# Patient Record
Sex: Female | Born: 1995 | Race: Black or African American | Hispanic: No | Marital: Single | Smoking: Never smoker
Health system: Southern US, Community
[De-identification: ages and names within clinical notes are randomized; demographics above are authoritative.]

## PROBLEM LIST (undated history)

## (undated) DIAGNOSIS — G808 Other cerebral palsy: Secondary | ICD-10-CM

## (undated) DIAGNOSIS — R569 Unspecified convulsions: Secondary | ICD-10-CM

## (undated) HISTORY — PX: REFRACTIVE SURGERY: SHX103

## (undated) HISTORY — PX: FLEXOR TENDON REPAIR: SHX1651

---

## 2016-02-11 ENCOUNTER — Encounter (HOSPITAL_COMMUNITY): Payer: Self-pay | Admitting: Emergency Medicine

## 2016-02-11 ENCOUNTER — Emergency Department (HOSPITAL_COMMUNITY)
Admission: EM | Admit: 2016-02-11 | Discharge: 2016-02-11 | Disposition: A | Discharge status: Home / Self Care | Payer: Medicaid Other | Attending: Emergency Medicine | Admitting: Emergency Medicine

## 2016-02-11 ENCOUNTER — Emergency Department (HOSPITAL_COMMUNITY): Payer: Medicaid Other

## 2016-02-11 DIAGNOSIS — Z8669 Personal history of other diseases of the nervous system and sense organs: Secondary | ICD-10-CM | POA: Insufficient documentation

## 2016-02-11 DIAGNOSIS — Z79899 Other long term (current) drug therapy: Secondary | ICD-10-CM | POA: Diagnosis not present

## 2016-02-11 DIAGNOSIS — R040 Epistaxis: Secondary | ICD-10-CM | POA: Diagnosis not present

## 2016-02-11 DIAGNOSIS — R51 Headache: Secondary | ICD-10-CM | POA: Diagnosis not present

## 2016-02-11 DIAGNOSIS — R519 Headache, unspecified: Secondary | ICD-10-CM

## 2016-02-11 DIAGNOSIS — Z793 Long term (current) use of hormonal contraceptives: Secondary | ICD-10-CM | POA: Diagnosis not present

## 2016-02-11 DIAGNOSIS — Z3202 Encounter for pregnancy test, result negative: Secondary | ICD-10-CM | POA: Diagnosis not present

## 2016-02-11 HISTORY — DX: Unspecified convulsions: R56.9

## 2016-02-11 HISTORY — DX: Other cerebral palsy: G80.8

## 2016-02-11 LAB — BASIC METABOLIC PANEL
Anion gap: 12 (ref 5–15)
BUN: 8 mg/dL (ref 6–20)
CALCIUM: 9.9 mg/dL (ref 8.9–10.3)
CO2: 24 mmol/L (ref 22–32)
CREATININE: 0.59 mg/dL (ref 0.44–1.00)
Chloride: 105 mmol/L (ref 101–111)
GFR calc non Af Amer: >60 mL/min (ref >60)
Glucose, Bld: 92 mg/dL (ref 65–99)
Potassium: 3.7 mmol/L (ref 3.5–5.1)
SODIUM: 141 mmol/L (ref 135–145)

## 2016-02-11 LAB — CBC
HCT: 40.8 % (ref 36.0–46.0)
Hemoglobin: 12.9 g/dL (ref 12.0–15.0)
MCH: 22.6 pg — ABNORMAL LOW (ref 26.0–34.0)
MCHC: 31.6 g/dL (ref 30.0–36.0)
MCV: 71.6 fL — ABNORMAL LOW (ref 78.0–100.0)
PLATELETS: 655 10*3/uL — AB (ref 150–400)
RBC: 5.7 MIL/uL — AB (ref 3.87–5.11)
RDW: 14.9 % (ref 11.5–15.5)
WBC: 10.8 10*3/uL — AB (ref 4.0–10.5)

## 2016-02-11 LAB — I-STAT BETA HCG BLOOD, ED (MC, WL, AP ONLY)

## 2016-02-11 LAB — CBG MONITORING, ED: Glucose-Capillary: 80 mg/dL (ref 65–99)

## 2016-02-11 MED ORDER — SALINE SPRAY 0.65 % NA SOLN: 1.0000 | NASAL | Status: DC | PRN

## 2016-02-11 NOTE — Discharge Instructions (Signed)
General Headache Without Cause A headache is pain or discomfort felt around the head or neck area. There are many causes and types of headaches. In some cases, the cause may not be found.  HOME CARE  Managing Pain  Take over-the-counter and prescription medicines only as told by your doctor.  Lie down in a dark, quiet room when you have a headache.  If directed, apply ice to the head and neck area:  Put ice in a plastic bag.  Place a towel between your skin and the bag.  Leave the ice on for 20 minutes, 2-3 times per day.  Use a heating pad or hot shower to apply heat to the head and neck area as told by your doctor.  Keep lights dim if Revolorio lights bother you or make your headaches worse. Eating and Drinking  Eat meals on a regular schedule.  Lessen how much alcohol you drink.  Lessen how much caffeine you drink, or stop drinking caffeine. General Instructions  Keep all follow-up visits as told by your doctor. This is important.  Keep a journal to find out if certain things bring on headaches. For example, write down:  What you eat and drink.  How much sleep you get.  Any change to your diet or medicines.  Relax by getting a massage or doing other relaxing activities.  Lessen stress.  Sit up straight. Do not tighten (tense) your muscles.  Do not use tobacco products. This includes cigarettes, chewing tobacco, or e-cigarettes. If you need help quitting, ask your doctor.  Exercise regularly as told by your doctor.  Get enough sleep. This often means 7-9 hours of sleep. GET HELP IF:  Your symptoms are not helped by medicine.  You have a headache that feels different than the other headaches.  You feel sick to your stomach (nauseous) or you throw up (vomit).  You have a fever. GET HELP RIGHT AWAY IF:   Your headache becomes really bad.  You keep throwing up.  You have a stiff neck.  You have trouble seeing.  You have trouble speaking.  You have  pain in the eye or ear.  Your muscles are weak or you lose muscle control.  You lose your balance or have trouble walking.  You feel like you will pass out (faint) or you pass out.  You have confusion.   This information is not intended to replace advice given to you by your health care provider. Make sure you discuss any questions you have with your health care provider.   Document Released: 09/17/2008 Document Revised: 08/30/2015 Document Reviewed: 04/03/2015 Elsevier Interactive Patient Education 2016 [employer]. Nosebleed Nosebleeds are common. They are due to a crack in the inside lining of your nose (mucous membrane) or from a small blood vessel that starts to bleed. Nosebleeds can be caused by many conditions, such as injury, infections, dry mucous membranes or dry climate, medicines, nose picking, and home heating and cooling systems. Most nosebleeds come from blood vessels in the front of your nose. HOME CARE INSTRUCTIONS   Try controlling your nosebleed by pinching your nostrils gently and continuously for at least 10 minutes.  Avoid blowing or sniffing your nose for a number of hours after having a nosebleed.  Do not put gauze inside your nose yourself. If your nose was packed by your health care provider, try to maintain the pack inside of your nose until your health care provider removes it.  If a gauze pack was used  and it starts to fall out, gently replace it or cut off the end of it.  If a balloon catheter was used to pack your nose, do not cut or remove it unless your health care provider has instructed you to do that.  Avoid lying down while you are having a nosebleed. Sit up and lean forward.  Use a nasal spray decongestant to help with a nosebleed as directed by your health care provider.  Do not use petroleum jelly or mineral oil in your nose. These can drip into your lungs.  Maintain humidity in your home by using less air conditioning or by using a  humidifier.  Aspirinand blood thinners make bleeding more likely. If you are prescribed these medicines and you suffer from nosebleeds, ask your health care provider if you should stop taking the medicines or adjust the dose. Do not stop medicines unless directed by your health care provider  Resume your normal activities as you are able, but avoid straining, lifting, or bending at the waist for several days.  If your nosebleed was caused by dry mucous membranes, use over-the-counter saline nasal spray or gel. This will keep the mucous membranes moist and allow them to heal. If you must use a lubricant, choose the water-soluble variety. Use it only sparingly, and do not use it within several hours of lying down.  Keep all follow-up visits as directed by your health care provider. This is important. SEEK MEDICAL CARE IF:  You have a fever.  You get frequent nosebleeds.  You are getting nosebleeds more often. SEEK IMMEDIATE MEDICAL CARE IF:  Your nosebleed lasts longer than 20 minutes.  Your nosebleed occurs after an injury to your face, and your nose looks crooked or broken.  You have unusual bleeding from other parts of your body.  You have unusual bruising on other parts of your body.  You feel light-headed or you faint.  You become sweaty.  You vomit blood.  Your nosebleed occurs after a head injury.   This information is not intended to replace advice given to you by your health care provider. Make sure you discuss any questions you have with your health care provider.   Document Released: 09/18/2005 Document Revised: 12/30/2014 Document Reviewed: 07/25/2014 Elsevier Interactive Patient Education [employer]2016 Elsevier Inc.

## 2016-02-11 NOTE — ED Provider Notes (Signed)
CSN: [id number]     Arrival date & time 02/11/16  1316 History   First MD Initiated Contact with Patient 02/11/16 1739     Chief Complaint  Patient presents with  . Headache  . 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  Patient states that over the past several weeks she has started getting severe headaches.  She states that when she gets the headaches, she will also get nosebleeds.  She states that then when the nosebleed stops, her headache resolves.  She is no longer followed by neurology.  She also states that occasionally she has staring spells, and is unaware of her surroundings during these spells.  She has not had any today. She is working to follow-up with neurology.  The history is provided by the patient. No language interpreter was used.    Past Medical History  Diagnosis Date  . Cerebral palsy, hemiplegic (HCC)     Left sided hemiplegia  . Seizures [facility])    Past Surgical History  Procedure Laterality Date  . Flexor tendon repair Left    History reviewed. No pertinent family history. Social History  Substance Use Topics  . Smoking status: None  . Smokeless tobacco: None  . Alcohol Use: None   OB History    No data available     Review of Systems  HENT: Positive for nosebleeds.   Neurological: Positive for headaches.  All other systems reviewed and are negative.     Allergies  Review of patient's allergies indicates no known allergies.  Home Medications   Prior to Admission medications   Medication Sig Start Date End Date Taking? Authorizing Provider  loratadine (CLARITIN) 10 MG tablet Take 1 tablet by mouth at bedtime. 01/31/16  Yes Historical Provider, MD  MINASTRIN 24 FE 1-20 MG-MCG(24) CHEW Chew 1 tablet by mouth at bedtime. 02/09/16  Yes Historical Provider, MD  montelukast (SINGULAIR)  10 MG tablet Take 1 tablet by mouth at bedtime. 01/31/16  Yes Historical Provider, MD   BP 154/96 mmHg  Pulse 97  Temp(Src) 98.5 F (36.9 C) (Oral)  Resp 18  SpO2 99% Physical Exam  Constitutional: She is oriented to person, place, and time. She appears well-developed and well-nourished.  HENT:  Head: Normocephalic and atraumatic.  Eyes: Conjunctivae and EOM are normal. Pupils are equal, round, and reactive to light.  Neck: Normal range of motion. Neck supple.  Cardiovascular: Normal rate and regular rhythm.  Exam reveals no gallop and no friction rub.   No murmur heard. Pulmonary/Chest: Effort normal and breath sounds normal. No respiratory distress. She has no wheezes. She has no rales. She exhibits no tenderness.  Abdominal: Soft. Bowel sounds are normal. She exhibits no distension and no mass. There is no tenderness. There is no rebound and no guarding.  Musculoskeletal: Normal range of motion. She exhibits no edema or tenderness.  Neurological: She is alert and oriented to person, place, and time.  Skin: Skin is warm and dry.  Psychiatric: She has a normal mood and affect. Her behavior is normal. Judgment and thought content normal.  Nursing note and vitals reviewed.   ED Course  Procedures (including critical care time) Results for orders placed or performed during the hospital encounter of 02/11/16  Basic metabolic panel - if new onset seizures  Result Value Ref Range   Sodium 141 135 - 145 mmol/L   Potassium 3.7 3.5 - 5.1  mmol/L   Chloride 105 101 - 111 mmol/L   CO2 24 22 - 32 mmol/L   Glucose, Bld 92 65 - 99 mg/dL   BUN 8 6 - 20 mg/dL   Creatinine, Ser 3.24 0.44 - 1.00 mg/dL   Calcium 9.9 8.9 - 40.1 mg/dL   GFR calc non Af Amer >60 >60 mL/min   GFR calc Af Amer >60 >60 mL/min   Anion gap 12 5 - 15  CBC - if new onset seizures  Result Value Ref Range   WBC 10.8 (H) 4.0 - 10.5 K/uL   RBC 5.70 (H) 3.87 - 5.11 MIL/uL   Hemoglobin 12.9 12.0 - 15.0 g/dL   HCT 02.7 25.3  - 66.4 %   MCV 71.6 (L) 78.0 - 100.0 fL   MCH 22.6 (L) 26.0 - 34.0 pg   MCHC 31.6 30.0 - 36.0 g/dL   RDW 40.3 47.4 - 25.9 %   Platelets 655 (H) 150 - 400 K/uL  CBG monitoring, ED  Result Value Ref Range   Glucose-Capillary 80 65 - 99 mg/dL  I-Stat beta hCG blood, ED ([facility], [facility], [facility] only)  Result Value Ref Range   I-stat hCG, quantitative <5.0 <5 mIU/mL   Comment 3           Ct Head Wo Contrast  02/11/2016  CLINICAL DATA:  Recurrent headaches and occasional nosebleeds. Altered mental status during these episodes. History of seizures. Initial encounter. EXAM: CT HEAD WITHOUT CONTRAST CT MAXILLOFACIAL WITHOUT CONTRAST TECHNIQUE: Multidetector CT imaging of the head and maxillofacial structures were performed using the standard protocol without intravenous contrast. Multiplanar CT image reconstructions of the maxillofacial structures were also generated. COMPARISON:  None. FINDINGS: CT HEAD FINDINGS There is volume loss in the right cerebral hemisphere with associated ex vacuo dilatation of the right lateral ventricle. No evidence of acute intracranial abnormality including hemorrhage, infarct, midline shift or abnormal extra-axial fluid collection is identified. No hydrocephalus or pneumocephalus. The calvarium is intact. Mild mucosal thickening is seen in the sphenoid sinus. CT MAXILLOFACIAL FINDINGS Mucous retention cyst or polyp is seen in the floor of the left maxillary sinus and there is also mild mucosal thickening in the left maxillary. Minimal mucosal thickening in the inferior aspect of the right maxillary sinus is identified. A very small mucous retention cyst or polyp is seen along the medial wall of the right maxillary sinus. Imaged paranasal sinuses are otherwise clear. The ostiomeatal complexes are patent. Concha bullosa are seen bilaterally. Imaged cervical spine appears normal. The orbits are unremarkable. IMPRESSION: No acute abnormality head or face. Volume loss in the right cerebral  hemisphere with associated ex vacuo dilatation of the right lateral ventricle. Mild sinus disease most notable in the left maxillary where there is a mucous retention cyst or polyp. Electronically Signed   By: Drusilla Kanner M.D.   On: 02/11/2016 18:33   Ct Maxillofacial Wo Cm  02/11/2016  CLINICAL DATA:  Recurrent headaches and occasional nosebleeds. Altered mental status during these episodes. History of seizures. Initial encounter. EXAM: CT HEAD WITHOUT CONTRAST CT MAXILLOFACIAL WITHOUT CONTRAST TECHNIQUE: Multidetector CT imaging of the head and maxillofacial structures were performed using the standard protocol without intravenous contrast. Multiplanar CT image reconstructions of the maxillofacial structures were also generated. COMPARISON:  None. FINDINGS: CT HEAD FINDINGS There is volume loss in the right cerebral hemisphere with associated ex vacuo dilatation of the right lateral ventricle. No evidence of acute intracranial abnormality including hemorrhage, infarct, midline shift or abnormal extra-axial fluid  collection is identified. No hydrocephalus or pneumocephalus. The calvarium is intact. Mild mucosal thickening is seen in the sphenoid sinus. CT MAXILLOFACIAL FINDINGS Mucous retention cyst or polyp is seen in the floor of the left maxillary sinus and there is also mild mucosal thickening in the left maxillary. Minimal mucosal thickening in the inferior aspect of the right maxillary sinus is identified. A very small mucous retention cyst or polyp is seen along the medial wall of the right maxillary sinus. Imaged paranasal sinuses are otherwise clear. The ostiomeatal complexes are patent. Concha bullosa are seen bilaterally. Imaged cervical spine appears normal. The orbits are unremarkable. IMPRESSION: No acute abnormality head or face. Volume loss in the right cerebral hemisphere with associated ex vacuo dilatation of the right lateral ventricle. Mild sinus disease most notable in the left  maxillary where there is a mucous retention cyst or polyp. Electronically Signed   By: Drusilla Kanner M.D.   On: 02/11/2016 18:33    I have personally reviewed and evaluated these images and lab results as part of my medical decision-making.    MDM   Final diagnoses:  Nonintractable headache, unspecified chronicity pattern, unspecified headache type  Epistaxis    Patient with headache and nosebleeds.  Possible absence seizures.  No symptoms currently, but concerned about lack of follow-up.  Will check head CT.  Patient discussed with Dr. Preston Fleeting, who recommends adding maxillofacial CT to evaluate sinuses.  Recommends ENT follow-up.  Could be sinus headache.  CT head as above.  Will recommend ENT follow-up.  Patient has an appointment on 3/2. Recommend follow-up with neurology as well for staring spells.     Roxy Horseman, PA-C 02/11/16 1850  Dione Booze, MD 02/11/16 [phone]

## 2016-02-11 NOTE — ED Notes (Signed)
C/o recurrent headaches with occasional nose bleeds, and pt starts staring off, unaware of her surroundings when this happens. Was seen at urgent care and the doctor believes she may be having seizures again, mother states she had seizures when she was younger but had stopped. Has been discharged from [facility] neurology a few years ago. Hx mild cerebral palsy with left hemiplegia, mother states the seizures she had when she was younger she presented like this.

## 2016-02-16 ENCOUNTER — Encounter: Payer: Self-pay | Admitting: Neurology

## 2016-02-16 ENCOUNTER — Ambulatory Visit (INDEPENDENT_AMBULATORY_CARE_PROVIDER_SITE_OTHER): Payer: Medicaid Other | Admitting: Neurology

## 2016-02-16 VITALS — BP 126/70 | HR 99 | Ht 62.5 in | Wt 194.0 lb

## 2016-02-16 DIAGNOSIS — R404 Transient alteration of awareness: Secondary | ICD-10-CM | POA: Diagnosis not present

## 2016-02-16 DIAGNOSIS — G802 Spastic hemiplegic cerebral palsy: Secondary | ICD-10-CM | POA: Insufficient documentation

## 2016-02-16 NOTE — Patient Instructions (Addendum)
1. Schedule MRI brain with and without contrast. We have scheduled you at [facility] for your MRI on 02/26/16 at 6:30 pm. Please arrive 30 minutes prior and go to [address]. If you need to change this appt please call [phone]. 2. Schedule 1-hour sleep-deprived EEG 3. Minimize intake of Tylenol/Ibuprofen/Aleve to 2-3 a times a week to avoid rebound headaches 4. As per [place] driving laws, after an episode of loss of awareness, one should not drive until 6 months event-free 5. Follow-up in 1 month, call for any changes

## 2016-02-16 NOTE — Progress Notes (Signed)
Note routed to Dr Wilford Grist and Filomena Jungling, NP.

## 2016-02-16 NOTE — Progress Notes (Signed)
NEUROLOGY CONSULTATION NOTE  Angela Lutz MRN: [id number] DOB: 12-13-1996  Referring provider: Dr. Filomena Jungling, FNP Primary care provider: Dr. Nile Riggs  Reason for consult:  History of seizures, possible recent seizure. Daily headaches  Thank you for your kind referral of Angela Lutz for consultation of the above symptoms. Although her history is well known to you, please allow me to reiterate it for the purpose of our medical record. I spoke to her mother on the phone to obtain further details on her childhood. Records and images were personally reviewed where available.  HISTORY OF PRESENT ILLNESS: This is a pleasant 20 year old right-handed woman with a history of congenital left hemiparesis, cerebral palsy, seizures, and headaches, previously followed at [facility], presenting due to concern of seizure recurrence and increasing headaches. Her mother reports that seizures started at age 89 or 3, she would have a blank stare and become unresponsive for a few seconds, no convulsive activity or myoclonic jerks. She was diagnosed with seizures and started on Tegretol. Per [facility] records, last seizure was in 2003. She was tapered off Tegretol in her early teens and her mother has not noticed any further staring spells, however over the past 2 years, Angela Lutz has noticed that she would stare off but just chalked it up to "staring off," until she went to an Urgent Care last week for epistaxis and was told she may be having seizures. Her roommate then told her that she sometimes has to snap her fingers in front of Angela Lutz and not realize that someone was trying to get her attention. She has an occasional taste of blood in her mouth, but feels this is due to the epistaxis that has been going on every other day. She sees ENT next week. She has a chronic history of headaches noted since at least 2012, diagnosed as tension-type headaches. She and her mother report that headaches have been  increasing in the past 2 years. Headaches are over the frontal region radiating to the back, with pressure, sharp/stabbing, or throbbing pain. Today she is having head pressure on the left parietal region. Headache intensity is from a 6 or 7 to 10 over 10, her vision gets blurry and she sees haloes around objects. She went to Urgent Care because she was having nausea with the headaches. Headaches last all day, she had been taking Ibuprofen every 6-8 hours, then stopped this briefly for a month, but since Thursday has been taking it multiple times a day. She denies any dizziness, tinnitus, focal numbness/tingling, dysarthria/dysphagia, bowel/bladder dysfunction. She has been having occasional neck pain with the headaches, and back pains for the past year. She is a Nutrition major graduating in 2019 or 2020, reports this has been a stressful semester but denies missing any parts of the lesson while sitting in class.  There is no family history of migraines or seizures. She was born full-term, her mother reports having surgery at 4 months of pregnancy, but no problems during delivery. They started to notice she was not using her left hand at 28 to 61 months of age, and was diagnosed with cerebral palsy at 6 or 7 months. No history of febrile convulsions, CNS infections such as meningitis/encephalitis, significant traumatic brain injury, neurosurgical procedures  I personally reviewed CT head without contrast done 02/11/16 which did not show any acute changes. There is volume loss in the right cerebral hemisphere with associated ex vacuo dilatation of the right lateral ventricle.   Prior AEDs: Tegretol  PAST MEDICAL HISTORY: Past Medical History  Diagnosis Date  . Cerebral palsy, hemiplegic (HCC)     Left sided hemiplegia  . Seizures (HCC)     PAST SURGICAL HISTORY: Past Surgical History  Procedure Laterality Date  . Flexor tendon repair Left   . Refractive surgery Left     MEDICATIONS: Current  Outpatient Prescriptions on File Prior to Visit  Medication Sig Dispense Refill  . loratadine (CLARITIN) 10 MG tablet Take 1 tablet by mouth at bedtime.  0  . MINASTRIN 24 FE 1-20 MG-MCG(24) CHEW Chew 1 tablet by mouth at bedtime.  0  . montelukast (SINGULAIR) 10 MG tablet Take 1 tablet by mouth at bedtime.  0  . sodium chloride (OCEAN) 0.65 % SOLN nasal spray Place 1 spray into both nostrils as needed for congestion. 1 Bottle 0   No current facility-administered medications on file prior to visit.    ALLERGIES: No Known Allergies  FAMILY HISTORY: Family History  Problem Relation Age of Onset  . Hypertension Mother     SOCIAL HISTORY: Social History   Social History  . Marital Status: Single    Spouse Name: N/A  . Number of Children: N/A  . Years of Education: N/A   Occupational History  . Not on file.   Social History Main Topics  . Smoking status: Never Smoker   . Smokeless tobacco: Not on file  . Alcohol Use: No  . Drug Use: No  . Sexual Activity: Not on file   Other Topics Concern  . Not on file   Social History Narrative    REVIEW OF SYSTEMS: Constitutional: No fevers, chills, or sweats, no generalized fatigue, change in appetite Eyes: No visual changes, double vision, eye pain Ear, nose and throat: No hearing loss, ear pain, nasal congestion, sore throat Cardiovascular: No chest pain, palpitations Respiratory:  No shortness of breath at rest or with exertion, wheezes GastrointestinaI: No nausea, vomiting, diarrhea, abdominal pain, fecal incontinence Genitourinary:  No dysuria, urinary retention or frequency Musculoskeletal:  + neck pain, back pain Integumentary: No rash, pruritus, skin lesions Neurological: as above Psychiatric: No depression, insomnia, anxiety Endocrine: No palpitations, fatigue, diaphoresis, mood swings, change in appetite, change in weight, increased thirst Hematologic/Lymphatic:  No anemia, purpura, petechiae. Allergic/Immunologic:  no itchy/runny eyes, nasal congestion, recent allergic reactions, rashes  PHYSICAL EXAM: Filed Vitals:   02/16/16 1346  BP: 126/70  Pulse: 99   General: No acute distress Head:  Normocephalic/atraumatic Eyes: Fundoscopic exam shows bilateral sharp discs, no vessel changes, exudates, or hemorrhages Neck: supple, no paraspinal tenderness, full range of motion Back: No paraspinal tenderness Heart: regular rate and rhythm Lungs: Clear to auscultation bilaterally. Vascular: No carotid bruits. Skin/Extremities: No rash, no edema Neurological Exam: Mental status: alert and oriented to person, place, and time, no dysarthria or aphasia, Fund of knowledge is appropriate.  Recent and remote memory are intact.  Attention and concentration are normal.    Able to name objects and repeat phrases. Cranial nerves: CN I: not tested CN II: pupils equal, round and reactive to light, visual fields intact, fundi unremarkable. CN III, IV, VI:  full range of motion, no nystagmus, no ptosis CN V: reports hyperesthesia to pin on left V1-2 CN VII: upper and lower face symmetric CN VIII: hearing intact to finger rub CN IX, X: gag intact, uvula midline CN XI: sternocleidomastoid and trapezius muscles intact CN XII: tongue midline Bulk & Tone: increased tone on left UE and LE with elbow held in flexion,  wrist flexion, foot pronated, no fasciculations. Motor: 5/5 on right UE and LE, 5/5 proximal left UE and LE, with 3/5 left finger flexion/extension, 2/5 left foot plantar/dorsiflexion. Sensation: reports decreased pin on left UE, decreased cold on left LE, hyperesthesia to pin on left LE, intact vibration sense.  Romberg test negative Deep Tendon Reflexes: +2 throughout, no ankle clonus but left ankle has increased tone Plantar responses: downgoing bilaterally Cerebellar: no incoordination on finger to nose on right, difficulty with left due to hemiparesis Gait: spastic hemiparetic gait  Tremor:  none  IMPRESSION: This is a pleasant 20 year old right-handed woman with a history of left hemiparetic cerebral palsy, seizures in childhood, tension-type headaches, presenting for recent episodes of staring concerning for seizure recurrence. MRI brain with and without contrast and 1-hour sleep-deprived EEG will be ordered as part of seizure workup. She is having chronic daily headaches, this appears to be a chronic condition since age 57, diagnosed as tension-type headaches, however there are some migrainous features to her headaches. We will discuss headache preventative medication on her follow-up, pending EEG results, we discussed that if EEG abnormal, would start a medication that helps with both seizure and headache prophylaxis. There may be a component of medication overuse headaches as well, she was advised to minimize rescue medication to 2-3 a week to avoid rebound headaches. [place] driving laws were discussed with the patient, and she knows to stop driving after a seizure, until 6 months seizure-free. She had been referred to OT in the past due to concern about left hand spasticity affecting driving. She will follow-up in 1 month.   Thank you for allowing me to participate in the care of this patient. Please do not hesitate to call for any questions or concerns.   Patrcia Dolly, M.D.  CC: Dr. Wilford Grist, Filomena Jungling, FNP

## 2016-02-26 ENCOUNTER — Other Ambulatory Visit: Payer: Self-pay | Admitting: *Deleted

## 2016-02-26 ENCOUNTER — Ambulatory Visit (INDEPENDENT_AMBULATORY_CARE_PROVIDER_SITE_OTHER): Payer: Medicaid Other | Admitting: Neurology

## 2016-02-26 ENCOUNTER — Ambulatory Visit
Admission: RE | Admit: 2016-02-26 | Discharge: 2016-02-26 | Disposition: A | Discharge status: Home / Self Care | Payer: Medicaid Other | Source: Ambulatory Visit | Attending: Neurology | Admitting: Neurology

## 2016-02-26 DIAGNOSIS — G802 Spastic hemiplegic cerebral palsy: Secondary | ICD-10-CM

## 2016-02-26 DIAGNOSIS — R404 Transient alteration of awareness: Secondary | ICD-10-CM

## 2016-02-26 MED ORDER — GADOBENATE DIMEGLUMINE 529 MG/ML IV SOLN
19.0000 mL | Freq: Once | INTRAVENOUS | Status: AC | PRN
  Administered 2016-02-26: 19 mL via INTRAVENOUS

## 2016-02-26 NOTE — Procedures (Signed)
ELECTROENCEPHALOGRAM REPORT  Date of Study: 02/26/2016  Patient's Name: Angela Lutz MRN: [id number] Date of Birth: 05-15-1996  Referring Provider: Dr. Patrcia DollyKaren Tab Rylee  Clinical History: This is a 20 year old woman with a history of left hemiparetic cerebral palsy, seizures in childhood, with recent episodes of staring concerning for seizure recurrence.   Medications: Claritin, Minastrin 24 Fe, Singulair  Technical Summary: A multichannel digital 1-hour sleep-deprived EEG recording measured by the international 10-20 system with electrodes applied with paste and impedances below 5000 ohms performed in our laboratory with EKG monitoring in an awake and asleep patient.  Hyperventilation and photic stimulation were performed.  The digital EEG was referentially recorded, reformatted, and digitally filtered in a variety of bipolar and referential montages for optimal display.    Description: The patient is awake and asleep during the recording.  During maximal wakefulness, there is a symmetric, medium voltage 10 Hz posterior dominant rhythm that attenuates with eye opening.  The record is symmetric.  During drowsiness and sleep, there is an increase in theta slowing of the background.  Vertex waves and symmetric sleep spindles were seen, with some asymmetry noted with higher amplitude seen over the left parasagittal derivations.  Hyperventilation and photic stimulation did not elicit any abnormalities.  There were no epileptiform discharges or electrographic seizures seen.    EKG lead was unremarkable.  Impression: This awake and asleep EEG is minimally abnormal due to asymmetric vertex waves with higher amplitude seen over the left parasagittal regions.  Clinical Correlation of the above findings indicates focal cerebral dysfunction over the right hemisphere with lower amplitude vertex waves in this region, suggestive of underlying structural abnormality. The absence of epileptiform discharges  does not exclude a clinical diagnosis of epilepsy.  If further clinical questions remain, prolonged EEG may be helpful.  Clinical correlation is advised.   Patrcia DollyKaren Inza Mikrut, M.D.

## 2016-03-05 ENCOUNTER — Telehealth: Payer: Self-pay | Admitting: Neurology

## 2016-03-05 NOTE — Telephone Encounter (Signed)
Have you reviewed her results? 

## 2016-03-05 NOTE — Telephone Encounter (Signed)
Discussed EEG and MRI results with patient. EEG was overall unremarkable, her MRI brain showed periventricular encephalomalacia on the right, as well as asymmetric T2 changes on the left hippocampus which can be sequelae from recent seizure. She reports 2 more episodes of staring off noticed by family. Would proceed with 24-hour EEG to further classify her symptoms. She expressed understanding.  Darl PikesSusan, can you pls schedule the 24-hour EEG, she is looking at the week of the 27th right now. Thanks

## 2016-03-05 NOTE — Telephone Encounter (Signed)
Pt would like results of MRI. Please call

## 2016-03-06 ENCOUNTER — Other Ambulatory Visit: Payer: Medicaid Other

## 2016-03-06 NOTE — Telephone Encounter (Signed)
Patients f/u appt has been moved to 04/04/16 @ 9am. Will write letter for her for school to be excused on 4/5 & 4/6, will mail letter along with eeg appt letter to her home address. Also made he aware of the change with her f/u appt.

## 2016-03-06 NOTE — Telephone Encounter (Signed)
Called to schedule 24 hour EEG on Wednesday April 5th. There were no available appointments during the week of March 27th. She requested a "doctor's note" which states she will be under doctor's care for those days April 5th and 6th. This is needed for school so she can be absent for that time. I told her I will send her an appointment letter. She stated that is not sufficient so I told her I will pass this on to Dr. Karel JarvisAquino and Elmarie Shileyiffany. She asked that it be faxed to her mother [phone] attention Maia PettiesVonda Moseley, her mother.

## 2016-03-15 ENCOUNTER — Ambulatory Visit: Payer: Medicaid Other | Admitting: Neurology

## 2016-03-22 ENCOUNTER — Ambulatory Visit: Payer: Medicaid Other | Admitting: Neurology

## 2016-03-27 ENCOUNTER — Ambulatory Visit (INDEPENDENT_AMBULATORY_CARE_PROVIDER_SITE_OTHER): Payer: Medicaid Other | Admitting: Neurology

## 2016-03-27 DIAGNOSIS — R404 Transient alteration of awareness: Secondary | ICD-10-CM

## 2016-03-27 DIAGNOSIS — G802 Spastic hemiplegic cerebral palsy: Secondary | ICD-10-CM

## 2016-03-28 ENCOUNTER — Ambulatory Visit: Payer: Medicaid Other | Admitting: Neurology

## 2016-04-04 ENCOUNTER — Ambulatory Visit: Payer: Medicaid Other | Admitting: Neurology

## 2016-04-08 ENCOUNTER — Encounter: Payer: Self-pay | Admitting: Neurology

## 2016-04-08 ENCOUNTER — Ambulatory Visit (INDEPENDENT_AMBULATORY_CARE_PROVIDER_SITE_OTHER): Payer: Medicaid Other | Admitting: Neurology

## 2016-04-08 VITALS — BP 120/92 | HR 89 | Ht 62.5 in | Wt 197.0 lb

## 2016-04-08 DIAGNOSIS — R404 Transient alteration of awareness: Secondary | ICD-10-CM | POA: Diagnosis not present

## 2016-04-08 DIAGNOSIS — G802 Spastic hemiplegic cerebral palsy: Secondary | ICD-10-CM

## 2016-04-08 DIAGNOSIS — R51 Headache: Secondary | ICD-10-CM

## 2016-04-08 DIAGNOSIS — R519 Headache, unspecified: Secondary | ICD-10-CM | POA: Insufficient documentation

## 2016-04-08 MED ORDER — TOPIRAMATE 25 MG PO TABS: ORAL_TABLET | ORAL | Status: DC

## 2016-04-08 NOTE — Patient Instructions (Signed)
1. Start Topamax 25mg : Take 1 tablet at night for 1 week, then increase to 2 tablets at night for 1 week, then increase to 3 tablets at night 2. Keep a calendar of your headaches and episodes of staring off 3. Follow-up in 5 months, call for any changes

## 2016-04-08 NOTE — Progress Notes (Signed)
NEUROLOGY FOLLOW UP OFFICE NOTE  Angela Lutz [id number]  HISTORY OF PRESENT ILLNESS: I had the pleasure of seeing Angela Lutz in follow-up in the neurology clinic on 04/08/2016.  The patient was last seen 2 months ago for concern of seizure recurrence with episodes of staring. She is accompanied by her sister who helps supplement the history today. Angela Lutz denies any episodes of staring off since her last visit. Her sister has not seen any of these staring episodes since Angela Lutz was a child. She continues to have headaches but reports these occur every other day, no associated nausea/vomiting. She occasional sees haloes around objects with the headaches. She denies any dizziness, diplopia, olfactory/gustatory hallucinations. She has chronic left hemiparesis that has been unchanged. She is on birth control.   Records and images were reviewed. I personally reviewed MRI brain with and without contrast which showed right hemisphere white matter volume loss in the frontal and parietal lobes with ex vacuo right lateral ventricle involvement (sparing the temporal and occipital horns). There was mild associated gliosis tracking into the deep posterior white matter capsules, associated mild volume loss at the right midbrain and cerebral peduncle. There was also note of asymmetric T2 hyperintensity in the left hippocampal formation, most apparent in the tail of the hippocampus, however the right hippocampus is smaller as well due to chronic injury. Her wake and sleep EEG was minimally abnormal due to asymmetric vertex waves with higher amplitude over the left parasagittal regions. Her 24-hour EEG was unremarkable, no epileptiform discharges seen. She reported a headaches, chest pain, shaky hand, and waking up at 1am feeling disoriented with a headache, no associated EEG changes seen.  HPI: This is a pleasant 20 yo RH woman with a history of congenital left hemiparesis, cerebral palsy, seizures, and headaches,  previously followed at [facility], who presented for worsening headaches and episodes of "staring off." Her mother reports that seizures started at age 38 or 3, she would have a blank stare and become unresponsive for a few seconds, no convulsive activity or myoclonic jerks. She was diagnosed with seizures and started on Tegretol. Per [facility] records, last seizure was in 2003. She was tapered off Tegretol in her early teens and her mother has not noticed any further staring spells, however over the past 2 years, Angela Lutz has noticed that she would stare off but just chalked it up to "staring off," until she went to an Urgent Care last week for epistaxis and was told she may be having seizures. Her roommate then told her that she sometimes has to snap her fingers in front of Angela Lutz and not realize that someone was trying to get her attention. She has an occasional taste of blood in her mouth, but feels this is due to the epistaxis that has been going on every other day. She sees ENT next week. She has a chronic history of headaches noted since at least 2012, diagnosed as tension-type headaches. She and her mother report that headaches have been increasing in the past 2 years. Headaches are over the frontal region radiating to the back, with pressure, sharp/stabbing, or throbbing pain. Today she is having head pressure on the left parietal region. Headache intensity is from a 6 or 7 to 10 over 10, her vision gets blurry and she sees haloes around objects. She went to Urgent Care because she was having nausea with the headaches. Headaches last all day, she had been taking Ibuprofen every 6-8 hours, then stopped this briefly for a  month, but since Thursday has been taking it multiple times a day. She denies any dizziness, tinnitus, focal numbness/tingling, dysarthria/dysphagia, bowel/bladder dysfunction. She has been having occasional neck pain with the headaches, and back pains for the past year. She is a Nutrition major  graduating in 2019 or 2020, reports this has been a stressful semester but denies missing any parts of the lesson while sitting in class.  There is no family history of migraines or seizures. She was born full-term, her mother reports having surgery at 4 months of pregnancy, but no problems during delivery. They started to notice she was not using her left hand at 28 to 43 months of age, and was diagnosed with cerebral palsy at 6 or 7 months. No history of febrile convulsions, CNS infections such as meningitis/encephalitis, significant traumatic brain injury, neurosurgical procedures  I personally reviewed CT head without contrast done 02/11/16 which did not show any acute changes. There is volume loss in the right cerebral hemisphere with associated ex vacuo dilatation of the right lateral ventricle.   Prior AEDs: Tegretol  PAST MEDICAL HISTORY: Past Medical History  Diagnosis Date  . Cerebral palsy, hemiplegic (HCC)     Left sided hemiplegia  . Seizures (HCC)     MEDICATIONS: Current Outpatient Prescriptions on File Prior to Visit  Medication Sig Dispense Refill  . loratadine (CLARITIN) 10 MG tablet Take 1 tablet by mouth at bedtime.  0  . MINASTRIN 24 FE 1-20 MG-MCG(24) CHEW Chew 1 tablet by mouth at bedtime.  0  . montelukast (SINGULAIR) 10 MG tablet Take 1 tablet by mouth at bedtime.  0  . sodium chloride (OCEAN) 0.65 % SOLN nasal spray Place 1 spray into both nostrils as needed for congestion. 1 Bottle 0   No current facility-administered medications on file prior to visit.    ALLERGIES: No Known Allergies  FAMILY HISTORY: Family History  Problem Relation Age of Onset  . Hypertension Mother     SOCIAL HISTORY: Social History   Social History  . Marital Status: Single    Spouse Name: N/A  . Number of Children: N/A  . Years of Education: N/A   Occupational History  . Not on file.   Social History Main Topics  . Smoking status: Never Smoker   . Smokeless tobacco: Not  on file  . Alcohol Use: No  . Drug Use: No  . Sexual Activity: Not on file   Other Topics Concern  . Not on file   Social History Narrative    REVIEW OF SYSTEMS: Constitutional: No fevers, chills, or sweats, no generalized fatigue, change in appetite Eyes: No visual changes, double vision, eye pain Ear, nose and throat: No hearing loss, ear pain, nasal congestion, sore throat Cardiovascular: No chest pain, palpitations Respiratory:  No shortness of breath at rest or with exertion, wheezes GastrointestinaI: No nausea, vomiting, diarrhea, abdominal pain, fecal incontinence Genitourinary:  No dysuria, urinary retention or frequency Musculoskeletal:  No neck pain, back pain Integumentary: No rash, pruritus, skin lesions Neurological: as above Psychiatric: No depression, insomnia, anxiety Endocrine: No palpitations, fatigue, diaphoresis, mood swings, change in appetite, change in weight, increased thirst Hematologic/Lymphatic:  No anemia, purpura, petechiae. Allergic/Immunologic: no itchy/runny eyes, nasal congestion, recent allergic reactions, rashes  PHYSICAL EXAM: Filed Vitals:   04/08/16 1352  BP: 120/92  Pulse: 89   General: No acute distress Head:  Normocephalic/atraumatic Neck: supple, no paraspinal tenderness, full range of motion Heart:  Regular rate and rhythm Lungs:  Clear to auscultation  bilaterally Back: No paraspinal tenderness Skin/Extremities: No rash, no edema Neurological Exam: alert and oriented to person, place, and time, no dysarthria or aphasia, Fund of knowledge is appropriate. Recent and remote memory are intact. Attention and concentration are normal. Able to name objects and repeat phrases. Cranial nerves: CN I: not tested CN II: pupils equal, round and reactive to light, visual fields intact, fundi unremarkable. CN III, IV, VI: full range of motion, no nystagmus, no ptosis CN V: intact to light touch CN VII: upper and lower face symmetric CN  VIII: hearing intact to finger rub CN IX, X: gag intact, uvula midline CN XI: sternocleidomastoid and trapezius muscles intact CN XII: tongue midline Bulk & Tone: increased tone on left UE and LE with elbow held in flexion, wrist flexion, foot pronated (similar to prior) Motor: 5/5 on right UE and LE, 5/5 proximal left UE and LE, with 3/5 left finger flexion/extension, 2/5 left foot plantar/dorsiflexion (similar to prior) Sensation: decreased light touch on left Deep Tendon Reflexes: +2 throughout, no ankle clonus but left ankle has increased tone Plantar responses: downgoing bilaterally Cerebellar: no incoordination on finger to nose on right, difficulty with left due to hemiparesis Gait: spastic hemiparetic gait  Tremor: none  IMPRESSION: This is a pleasant 20 yo RH woman with a history of left hemiparetic cerebral palsy, seizures in childhood, tension-type headaches, presenting for recent episodes of staring. MRI brain with and without contrast showed chronic volume loss in the right hemisphere with ex vacuo lateral ventricle enlargement, as well as concern for asymmetric T2 hyperintensity in the left hippocampus, suggesting sequelae of recent seizure, however due to chronic changes on the right side, it is unclear if asymmetry is due to chronic changes. Her 24-hour EEG did not show any epileptiform discharges. We discussed that it is unclear if the staring episodes are seizures. Headaches have been diagnosed as tension-type headaches in the past, however there are some migrainous features as well. We agreed to starting a daily headache prophylactic medication, Topamax, which is also an anti-epileptic medication, she will keep a calendar of her headaches and the staring episodes and monitor for response. Side effects of Topamax were discussed, start  qhs x 1 week, then increase weekly to  qhs. We discussed potential side effects on the unborn fetus, she has no pregnancy plans and is on  birth control. [place] driving laws were discussed with the patient, and she knows to stop driving after a seizure, until 6 months seizure-free. She will follow-up in 5 months and knows to call for any changes.   Thank you for allowing me to participate in her care.  Please do not hesitate to call for any questions or concerns.  The duration of this appointment visit was 25 minutes of face-to-face time with the patient.  Greater than 50% of this time was spent in counseling, explanation of diagnosis, planning of further management, and coordination of care.   Patrcia Dolly, M.D.   CC: Dr. Theador Hawthorne

## 2016-04-09 NOTE — Procedures (Signed)
ELECTROENCEPHALOGRAM REPORT  Dates of Recording: 03/27/2016 to 03/28/2016  Patient's Name: Angela Lutz MRN: [id number] Date of Birth: 08/20/1996  Referring Provider: Dr. 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  Medications: Claritin, Singulair, birth control  Technical Summary: This is a 24-hour multichannel digital EEG recording measured by the international 10-20 system with electrodes applied with paste and impedances below 5000 ohms performed as portable with EKG monitoring.  The digital EEG was referentially recorded, reformatted, and digitally filtered in a variety of bipolar and referential montages for optimal display.    DESCRIPTION OF RECORDING: During maximal wakefulness, the background activity consisted of a symmetric 10 Hz posterior dominant rhythm which was reactive to eye opening.  There were no epileptiform discharges or focal slowing seen in wakefulness.  During the recording, the patient progresses through wakefulness, drowsiness, and Stage 2 sleep.  Again, there were no epileptiform discharges seen.  Events: On 04/05 at 1507 hours, she reports headache. Electrographically, there were no EEG or EKG changes seen.  On 04/05 at 1508 hours, she reports sharp chest pain on the left side. Electrographically, there were no EEG or EKG changes seen.  On 04/05 at 1637 hours, she has a sharp pain on the left side of her head. Electrographically, there were no EEG or EKG changes seen.  On 04/05 at 2319 hours, she reports headache and shaky hand. Electrographically, there were no EEG or EKG changes seen.  On 04/06 at 0106 hours, she reports feeling disoriented with headache. Electrographically, there were no EEG or EKG changes seen.  There were no electrographic seizures seen.  EKG lead was unremarkable.  IMPRESSION:  This 24-hour ambulatory EEG study is normal.   CLINICAL CORRELATION: A normal EEG does not exclude a clinical diagnosis of epilepsy. Typical episodes of staring off were not captured. Episodes of headache, chest pain, and feeling disoriented did not show any EEG correlate. If further clinical questions remain, inpatient video EEG monitoring may be helpful.   Patrcia DollyKaren Aquino, M.D.

## 2016-04-11 ENCOUNTER — Telehealth: Payer: Self-pay | Admitting: Neurology

## 2016-04-11 NOTE — Telephone Encounter (Signed)
PT called in regards to her medication that was prescribed to her for headaches and she said she has an even worse headache after taking it/Dawn CB# [phone]

## 2016-04-11 NOTE — Telephone Encounter (Signed)
I spoke with her. She states she took the Topamax for the 1st time last night and she woke us this morning with a bad headache, states she felt like somebody was "pounding" her head. She states that her usual headaches are nothing like what she was feeling. States that pain has subsided some. She is aware that you are not in the office this afternoon, I advised her to hold her dose for tonight and I would call her back in the morning with further advisement.

## 2016-04-14 NOTE — Telephone Encounter (Signed)
Let's try doing an even lower dose, do 1/2 tablet at night for 2 weeks, then if tolerating increase to 1 tablet qhs. If still having issues, another option is Zonisamide 100mg  qhs. Side effects include numbness/tingling in hands/feet, very low risk of kidney stones. Thanks

## 2016-04-15 NOTE — Telephone Encounter (Signed)
Spoke with patient notified her of advisements & about different medication with possible side effects. Advised her to call us back if she's still having issues with the Topamax after titration back up to 1 tablet at bedtime.

## 2016-08-27 ENCOUNTER — Telehealth: Payer: Self-pay | Admitting: Neurology

## 2016-08-27 NOTE — Telephone Encounter (Signed)
PT called and wanted to talk to someone about her recurring headaches/Dawn [phone]#[phone]

## 2016-08-27 NOTE — Telephone Encounter (Signed)
Patient was returning your call. Thank you °

## 2016-08-27 NOTE — Telephone Encounter (Signed)
Left VM for pt to call back.

## 2016-08-27 NOTE — Telephone Encounter (Signed)
Patient called back and said she will be unavailable for about the next hour. She said you could speak with her mother Waldron SessionVonda [phone]. Thank you

## 2016-08-28 NOTE — Telephone Encounter (Signed)
Pt states that she is no longer taking Topamax. Advised pt this can be discussed at next weeks OV.

## 2016-09-06 ENCOUNTER — Ambulatory Visit (INDEPENDENT_AMBULATORY_CARE_PROVIDER_SITE_OTHER): Payer: Medicaid Other | Admitting: Neurology

## 2016-09-06 ENCOUNTER — Encounter: Payer: Self-pay | Admitting: Neurology

## 2016-09-06 VITALS — BP 140/72 | HR 95 | Temp 97.9°F | Ht 62.5 in | Wt 203.1 lb

## 2016-09-06 DIAGNOSIS — R404 Transient alteration of awareness: Secondary | ICD-10-CM

## 2016-09-06 DIAGNOSIS — R519 Headache, unspecified: Secondary | ICD-10-CM

## 2016-09-06 DIAGNOSIS — R51 Headache: Secondary | ICD-10-CM

## 2016-09-06 DIAGNOSIS — G802 Spastic hemiplegic cerebral palsy: Secondary | ICD-10-CM

## 2016-09-06 MED ORDER — PROPRANOLOL HCL 20 MG PO TABS: ORAL_TABLET | ORAL | 6 refills | Status: DC

## 2016-09-06 NOTE — Patient Instructions (Signed)
1. Start Propranolol 20mg : Take 1 tablet at bedtime 2. Keep a calendar of your headaches and staring episodes 3. Contact [employer] [facility] [place]. [phone].  4. As per [place] driving laws, for any episode of loss of consciousness/awareness, no driving until 6 months event-free 5. Follow-up in 3 months, call for any changes

## 2016-09-06 NOTE — Progress Notes (Signed)
NEUROLOGY FOLLOW UP OFFICE NOTE  Angela Lutz [id number]  HISTORY OF PRESENT ILLNESS: I had the pleasure of seeing Angela Lutz in follow-up in the neurology clinic on 09/06/2016.  The patient was last seen 5 months ago for concern of seizure recurrence with episodes of staring, and for chronic headaches. She denies any staring episodes since February 2017. She was started on Topamax last April for headache and possible seizures, but had worse headaches and stopped the Topamax. She reports that she was headache-free during the summer, but now that school is back, headaches have recurred and she thinks they are stress-related. She had headaches for 2 weeks straight, unresponsive to over the counter medication, no associated nausea/vomiting. She denies any dizziness, diplopia, olfactory/gustatory hallucinations. She has chronic left hemiparesis that has been unchanged. She fell off a chair last Wednesday, no injuries.   HPI 02/16/16: This is a pleasant 20 yo RH woman with a history of congenital left hemiparesis, cerebral palsy, seizures, and headaches, previously followed at [facility], who presented for worsening headaches and episodes of "staring off." Her mother reports that seizures started at age 16 or 3, she would have a blank stare and become unresponsive for a few seconds, no convulsive activity or myoclonic jerks. She was diagnosed with seizures and started on Tegretol. Per [facility] records, last seizure was in 2003. She was tapered off Tegretol in her early teens and her mother has not noticed any further staring spells, however over the past 2 years, Angela Lutz has noticed that she would stare off but just chalked it up to "staring off," until she went to an Urgent Care for epistaxis and was told she may be having seizures. Her roommate then told her that she sometimes has to snap her fingers in front of Angela Lutz and not realize that someone was trying to get her attention. She has an occasional taste  of blood in her mouth, but feels this is due to the epistaxis that has been going on every other day.  She has a chronic history of headaches noted since at least 2012, diagnosed as tension-type headaches. She and her mother report that headaches have been increasing in the past 2 years. Headaches are over the frontal region radiating to the back, with pressure, sharp/stabbing, or throbbing pain. Today she is having head pressure on the left parietal region. Headache intensity is from a 6 or 7 to 10 over 10, her vision gets blurry and she sees haloes around objects. She went to Urgent Care because she was having nausea with the headaches. Headaches last all day, she had been taking Ibuprofen every 6-8 hours, then stopped this briefly for a month, but since Thursday has been taking it multiple times a day. She denies any dizziness, tinnitus, focal numbness/tingling, dysarthria/dysphagia, bowel/bladder dysfunction. She has been having occasional neck pain with the headaches, and back pains for the past year. She is a Nutrition major graduating in 2019 or 2020, reports this has been a stressful semester but denies missing any parts of the lesson while sitting in class.  There is no family history of migraines or seizures. She was born full-term, her mother reports having surgery at 4 months of pregnancy, but no problems during delivery. They started to notice she was not using her left hand at 64 to 39 months of age, and was diagnosed with cerebral palsy at 6 or 7 months. No history of febrile convulsions, CNS infections such as meningitis/encephalitis, significant traumatic brain injury, neurosurgical procedures  Diagnostic Data: I personally reviewed CT head without contrast done 02/11/16 which did not show any acute changes. There is volume loss in the right cerebral hemisphere with associated ex vacuo dilatation of the right lateral ventricle.  MRI brain with and without contrast showed right hemisphere white  matter volume loss in the frontal and parietal lobes with ex vacuo right lateral ventricle involvement (sparing the temporal and occipital horns). There was mild associated gliosis tracking into the deep posterior white matter capsules, associated mild volume loss at the right midbrain and cerebral peduncle. There was also note of asymmetric T2 hyperintensity in the left hippocampal formation, most apparent in the tail of the hippocampus, however the right hippocampus is smaller as well due to chronic injury.  Her wake and sleep EEG was minimally abnormal due to asymmetric vertex waves with higher amplitude over the left parasagittal regions.  Her 24-hour EEG was normal, no epileptiform discharges seen.  Episodes of headache, chest pain, and feeling disoriented did not show EEG correlate.  Prior AEDs: Tegretol  PAST MEDICAL HISTORY: Past Medical History:  Diagnosis Date  . Cerebral palsy, hemiplegic (HCC)    Left sided hemiplegia  . Seizures (HCC)     MEDICATIONS: Current Outpatient Prescriptions on File Prior to Visit  Medication Sig Dispense Refill  . loratadine (CLARITIN) 10 MG tablet Take 1 tablet by mouth at bedtime.  0  . MINASTRIN 24 FE 1-20 MG-MCG(24) CHEW Chew 1 tablet by mouth at bedtime.  0  . montelukast (SINGULAIR) 10 MG tablet Take 1 tablet by mouth at bedtime.  0  . sodium chloride (OCEAN) 0.65 % SOLN nasal spray Place 1 spray into both nostrils as needed for congestion. 1 Bottle 0  . topiramate (TOPAMAX) 25 MG tablet Take 1 tablet at night for 1 week, then increase to 2 tablets at night for 1 week, then increase to 3 tablets at night (Patient not taking: Reported on 09/06/2016) 90 tablet 6   No current facility-administered medications on file prior to visit.     ALLERGIES: No Known Allergies  FAMILY HISTORY: Family History  Problem Relation Age of Onset  . Hypertension Mother     SOCIAL HISTORY: Social History   Social History  . Marital status: Single     Spouse name: N/A  . Number of children: N/A  . Years of education: N/A   Occupational History  . Not on file.   Social History Main Topics  . Smoking status: Never Smoker  . Smokeless tobacco: Not on file  . Alcohol use No  . Drug use: No  . Sexual activity: Not on file   Other Topics Concern  . Not on file   Social History Narrative  . No narrative on file    REVIEW OF SYSTEMS: Constitutional: No fevers, chills, or sweats, no generalized fatigue, change in appetite Eyes: No visual changes, double vision, eye pain Ear, nose and throat: No hearing loss, ear pain, nasal congestion, sore throat Cardiovascular: No chest pain, palpitations Respiratory:  No shortness of breath at rest or with exertion, wheezes GastrointestinaI: No nausea, vomiting, diarrhea, abdominal pain, fecal incontinence Genitourinary:  No dysuria, urinary retention or frequency Musculoskeletal:  No neck pain, back pain Integumentary: No rash, pruritus, skin lesions Neurological: as above Psychiatric: No depression, insomnia, anxiety Endocrine: No palpitations, fatigue, diaphoresis, mood swings, change in appetite, change in weight, increased thirst Hematologic/Lymphatic:  No anemia, purpura, petechiae. Allergic/Immunologic: no itchy/runny eyes, nasal congestion, recent allergic reactions, rashes  PHYSICAL EXAM: Vitals:  09/06/16 1446  BP: 140/72  Pulse: 95  Temp: 97.9 F (36.6 C)   General: No acute distress Head:  Normocephalic/atraumatic Neck: supple, no paraspinal tenderness, full range of motion Heart:  Regular rate and rhythm Lungs:  Clear to auscultation bilaterally Back: No paraspinal tenderness Skin/Extremities: No rash, no edema Neurological Exam: alert and oriented to person, place, and time, no dysarthria or aphasia, Fund of knowledge is appropriate. Recent and remote memory are intact. Attention and concentration are normal. Able to name objects and repeat phrases. Cranial  nerves: CN I: not tested CN II: pupils equal, round and reactive to light, visual fields intact, fundi unremarkable. CN III, IV, VI: full range of motion, no nystagmus, no ptosis CN V: intact to light touch CN VII: upper and lower face symmetric CN VIII: hearing intact to finger rub CN IX, X: gag intact, uvula midline CN XI: sternocleidomastoid and trapezius muscles intact CN XII: tongue midline Bulk & Tone: increased tone on left UE and LE with elbow held in flexion, wrist flexion, foot pronated (similar to prior) Motor: 5/5 on right UE and LE, 5/5 proximal left UE and LE, with 3/5 left finger flexion/extension, 2/5 left foot plantar/dorsiflexion (similar to prior) Sensation: decreased light touch on left Deep Tendon Reflexes: +2 throughout, no ankle clonus but left ankle has increased tone Plantar responses: downgoing bilaterally Cerebellar: no incoordination on finger to nose on right, difficulty with left due to hemiparesis Gait: spastic hemiparetic gait  Tremor: none  IMPRESSION: This is a pleasant 20 yo RH woman with a history of left hemiparetic cerebral palsy, seizures in childhood, tension-type headaches, who presented for concern of recurrence of staring spells and continued headaches. MRI brain with and without contrast showed chronic volume loss in the right hemisphere with ex vacuo lateral ventricle enlargement, as well as concern for asymmetric T2 hyperintensity in the left hippocampus, suggesting sequelae of recent seizure, however due to chronic changes on the right side, it is unclear if asymmetry is due to chronic changes. Her 24-hour EEG did not show any epileptiform discharges. No staring episodes since February 2017. She reports being headache-free during the summer, recurring now that she is back to school, suggestive of tension-type headaches. She did not tolerate Topamax. She will start Propranolol 20mg  qhs, side effects were discussed. [place] driving laws were discussed  with the patient, and she knows to stop driving after an episode of loss of awareness, until 6 months event-free. She will keep a headache calendar and follow-up in 3 months. She knows to call for any changes.   Thank you for allowing me to participate in her care.  Please do not hesitate to call for any questions or concerns.  The duration of this appointment visit was 25 minutes of face-to-face time with the patient.  Greater than 50% of this time was spent in counseling, explanation of diagnosis, planning of further management, and coordination of care.   Patrcia DollyKaren Aquino, M.D.   CC: Dr. Theador HawthorneAhdieh

## 2016-09-10 ENCOUNTER — Emergency Department (HOSPITAL_COMMUNITY)
Admission: EM | Admit: 2016-09-10 | Discharge: 2016-09-10 | Disposition: A | Discharge status: Home / Self Care | Payer: Medicaid Other | Attending: Emergency Medicine | Admitting: Emergency Medicine

## 2016-09-10 ENCOUNTER — Encounter (HOSPITAL_COMMUNITY): Payer: Self-pay | Admitting: Emergency Medicine

## 2016-09-10 DIAGNOSIS — Z79899 Other long term (current) drug therapy: Secondary | ICD-10-CM | POA: Insufficient documentation

## 2016-09-10 DIAGNOSIS — G43009 Migraine without aura, not intractable, without status migrainosus: Secondary | ICD-10-CM

## 2016-09-10 DIAGNOSIS — R51 Headache: Secondary | ICD-10-CM | POA: Diagnosis present

## 2016-09-10 DIAGNOSIS — Z791 Long term (current) use of non-steroidal anti-inflammatories (NSAID): Secondary | ICD-10-CM | POA: Diagnosis not present

## 2016-09-10 DIAGNOSIS — G43909 Migraine, unspecified, not intractable, without status migrainosus: Secondary | ICD-10-CM | POA: Diagnosis not present

## 2016-09-10 MED ORDER — DIPHENHYDRAMINE HCL 50 MG/ML IJ SOLN
12.5000 mg | Freq: Once | INTRAMUSCULAR | Status: AC
  Administered 2016-09-10: 12.5 mg via INTRAVENOUS
  Filled 2016-09-10: qty 1

## 2016-09-10 MED ORDER — KETOROLAC TROMETHAMINE 30 MG/ML IJ SOLN
30.0000 mg | Freq: Once | INTRAMUSCULAR | Status: AC
  Administered 2016-09-10: 30 mg via INTRAVENOUS
  Filled 2016-09-10: qty 1

## 2016-09-10 MED ORDER — METOCLOPRAMIDE HCL 5 MG/ML IJ SOLN
10.0000 mg | Freq: Once | INTRAMUSCULAR | Status: AC
  Administered 2016-09-10: 10 mg via INTRAVENOUS
  Filled 2016-09-10: qty 2

## 2016-09-10 MED ORDER — DEXAMETHASONE SODIUM PHOSPHATE 10 MG/ML IJ SOLN
10.0000 mg | Freq: Once | INTRAMUSCULAR | Status: AC
  Administered 2016-09-10: 10 mg via INTRAMUSCULAR
  Filled 2016-09-10: qty 1

## 2016-09-10 MED ORDER — SODIUM CHLORIDE 0.9 % IV BOLUS (SEPSIS)
1000.0000 mL | Freq: Once | INTRAVENOUS | Status: AC
  Administered 2016-09-10: 1000 mL via INTRAVENOUS

## 2016-09-10 NOTE — ED Triage Notes (Signed)
Pt has HA on top of head and on back of head. Also feels "numb" all over. Has been to neurologist for this last week. Was given medication, but made her too dizzy. HA worse upon awakening and going to sleep.

## 2016-09-10 NOTE — ED Notes (Signed)
Pt reports Rx Ibuprofen 800mg  only provides relief for 5hrs.

## 2016-09-10 NOTE — ED Provider Notes (Signed)
[facility]-EMERGENCY DEPT Provider Note   CSN: [id number] Arrival date & time: 09/10/16  1728  By signing my name below, I, Angela Lutz, attest that this documentation has been prepared under the direction and in the presence of non-physician practitioner, Angela Madura, PA-C. Electronically Signed: Majel Lutz, Scribe. 09/10/2016. 8:56 PM.  History   Chief Complaint Chief Complaint  Patient presents with  . Headache   The history is provided by the patient. No language interpreter was used.   HPI Comments: Angela Lutz is a 20 y.o. female with PMHx of cerebral palsy and seizures, who presents to the Emergency Department complaining of gradually worsening, "sharp," headache to the top and back of her head that began a few weeks ago and worsened today. Pt reports associated nausea, vomiting and generalized, intermittent, subjective numbness; she states these symptoms are consistent with her chronic headaches. She notes her last episode of vomiting was 2 days ago. She states her headache is exacerbated when waking up and falling asleep. Pt reports she visited her neurologist at [facility] on 9/15 for her symptoms and was prescribed propanolol with no relief. She states she woke up at ~2:00 AM a few days ago to use the restroom and felt dizzy like she was "going to pass out" after taking this medication. She states she has taken ibuprofen 800 mg with moderate relief; however, her headache is only relieved for ~3 hours. She denies recent head injury, photophobia, phonophobia and fever.    Past Medical History:  Diagnosis Date  . Cerebral palsy, hemiplegic (HCC)    Left sided hemiplegia  . Seizures [facility])     Patient Active Problem List   Diagnosis Date Noted  . Chronic daily headache 04/08/2016  . Transient alteration of awareness 02/16/2016  . 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 FLEXOR TENDON REPAIR Left   .  REFRACTIVE SURGERY Left     OB History    No data available     Home Medications    Prior to Admission medications   Medication Sig Start Date End Date Taking? Authorizing Provider  ibuprofen (ADVIL,MOTRIN) 800 MG tablet take 1 tablet by mouth every 8 hours if needed for pain (FOR MENSTRUAL PAIN, TAKE WITH FOOD) 08/23/16  Yes Historical Provider, MD  loratadine (CLARITIN) 10 MG tablet Take 1 tablet by mouth at bedtime. 01/31/16  Yes Historical Provider, MD  MINASTRIN 24 FE 1-20 MG-MCG(24) CHEW Chew 1 tablet by mouth at bedtime. 02/09/16  Yes Historical Provider, MD  montelukast (SINGULAIR) 10 MG tablet Take 1 tablet by mouth at bedtime. 01/31/16  Yes Historical Provider, MD  propranolol (INDERAL) 20 MG tablet Take 1 tablet at night Patient not taking: Reported on 09/10/2016 09/06/16   Van Clines, MD  sodium chloride (OCEAN) 0.65 % SOLN nasal spray Place 1 spray into both nostrils as needed for congestion. Patient not taking: Reported on 09/10/2016 02/11/16   Angela Horseman, PA-C  topiramate (TOPAMAX) 25 MG tablet Take 1 tablet at night for 1 week, then increase to 2 tablets at night for 1 week, then increase to 3 tablets at night Patient not taking: Reported on 09/10/2016 04/08/16   Van Clines, MD    Family History Family History  Problem Relation Age of Onset  . Hypertension Mother     Social History Social History  Substance Use Topics  . Smoking status: Never Smoker  . Smokeless tobacco: Not on file  .  Alcohol use No     Allergies   Review of patient's allergies indicates no known allergies.   Review of Systems Review of Systems  Constitutional: Negative for fever.  Eyes: Negative for photophobia.  Gastrointestinal: Positive for nausea and vomiting.  Neurological: Positive for light-headedness, numbness (subjective) and headaches.  Ten systems reviewed and are negative for acute change, except as noted in the HPI.    Physical Exam Updated Vital Signs BP (!) 157/105  (BP Location: Right Arm)   Pulse 115   Temp 98.8 F (37.1 C) (Oral)   Resp 16   SpO2 100%   Physical Exam  Constitutional: She is oriented to person, place, and time. She appears well-developed and well-nourished. No distress.  Nontoxic and in no distress  HENT:  Head: Normocephalic and atraumatic.  Symmetric rise of the uvula with phonation  Eyes: Conjunctivae and EOM are normal. Pupils are equal, round, and reactive to light. No scleral icterus.  Neck: Normal range of motion.  No nuchal rigidity or meningismus  Cardiovascular: Normal rate, regular rhythm and intact distal pulses.   Pulmonary/Chest: Effort normal. No respiratory distress. She has no wheezes.  Respirations even and unlabored  Musculoskeletal: Normal range of motion.  Neurological: She is alert and oriented to person, place, and time. No cranial nerve deficit. She exhibits normal muscle tone. Coordination normal.  GCS 15. Speech is goal oriented. No cranial nerve deficits appreciated; symmetric eyebrow raise, no facial drooping, tongue midline. Patient has equal grip strength bilaterally with 5/5 strength against resistance in all major muscle groups bilaterally. Sensation to light touch intact. Patient ambulatory with steady gait.  Skin: Skin is warm and dry. No rash noted. She is not diaphoretic. No erythema. No pallor.  Psychiatric: She has a normal mood and affect. Her behavior is normal.  Nursing note and vitals reviewed.    ED Treatments / Results  Labs (all labs ordered are listed, but only abnormal results are displayed) Labs Reviewed - No data to display  EKG  EKG Interpretation None       Radiology No results found.  Procedures Procedures (including critical care time)  Medications Ordered in ED Medications  dexamethasone (DECADRON) injection 10 mg (10 mg Intramuscular Given 09/10/16 2121)  ketorolac (TORADOL) 30 MG/ML injection 30 mg (30 mg Intravenous Given 09/10/16 2122)  metoCLOPramide  (REGLAN) injection 10 mg (10 mg Intravenous Given 09/10/16 2122)  diphenhydrAMINE (BENADRYL) injection 12.5 mg (12.5 mg Intravenous Given 09/10/16 2122)  sodium chloride 0.9 % bolus 1,000 mL (1,000 mLs Intravenous New Bag/Given 09/10/16 2122)     Initial Impression / Assessment and Plan / ED Course  I have reviewed the triage vital signs and the nursing notes.  Pertinent labs & imaging results that were available during my care of the patient were reviewed by me and considered in my medical decision making (see chart for details).  Clinical Course  DIAGNOSTIC STUDIES:  Oxygen Saturation is 100% on RA, normal by my interpretation.    COORDINATION OF CARE:  8:47 PM  Discussed treatment plan with pt at bedside and pt agreed to plan.  10:45 PM Patient reassessed. She states that her headache has improved. Given her reassuring exam, low suspicion for emergent intracranial process. Patient reports that headache is consistent with two-year history of migraines. No nuchal rigidity, meningismus, or fever to suggest meningitis. We will discharge with instructions for supportive care. Patient advised to follow-up with her neurologist. Return precautions discussed and provided. Patient discharged in satisfactory condition with  no unaddressed concerns.  I personally performed the services described in this documentation, which was scribed in my presence. The recorded information has been reviewed and is accurate.   Final Clinical Impressions(s) / ED Diagnoses   Final diagnoses:  Migraine without aura and without status migrainosus, not intractable    New Prescriptions New Prescriptions   No medications on file     Angela MaduraKelly Charmika Macdonnell, PA-C 09/10/16 2314    Donnetta HutchingBrian Cook, MD 09/11/16 [phone]

## 2016-09-20 ENCOUNTER — Telehealth: Payer: Self-pay | Admitting: Neurology

## 2016-09-20 MED ORDER — IBUPROFEN 800 MG PO TABS: 800.0000 mg | ORAL_TABLET | ORAL | 6 refills | Status: DC | PRN

## 2016-09-20 NOTE — Telephone Encounter (Signed)
She can take the Ibuprofen only as needed for severe headaches, do not take more than 2-3 a week. We only give 10 tablets a month. Pls send Rx for Ibuprofen 800mg : Take at onset of headache, do not take more than 2-3 times a week. Dispense #10 with 6 refills. Thanks

## 2016-09-20 NOTE — Telephone Encounter (Signed)
Angela Lutz 10/30/1996. She was calling regarding the number for [facility] [place] that Dr. Karel JarvisAquino referred her to. Her # is [phone]. Thank you

## 2016-09-20 NOTE — Telephone Encounter (Signed)
Pt states Propranolol did not work for her. She ended up going to ED last week and they recommended she ask us to give her a prescription for something more like Ibuprofen. She said the medication was not good for her blood pressure. Please advise.

## 2016-09-20 NOTE — Telephone Encounter (Signed)
Rx Ibuprofen sent. Pt notified.

## 2016-10-09 IMAGING — MR MR HEAD WO/W CM
11 series · 48 of 48 positions shown · IV contrast (19 ml multihance)
Comparison: Head and face CT 02/11/2016

CLINICAL DATA: 19-year-old female with episodes in the last few
weeks of seizure like activity, staring spells. Personal history of
seizures as a child, and left arm paralysis due to cerebral palsy.
Subsequent encounter.

EXAM:
MRI HEAD WITHOUT AND WITH CONTRAST
TECHNIQUE: Multiplanar, multiecho pulse sequences of the brain and surrounding
structures were obtained without and with intravenous contrast.
CONTRAST:  19mL MULTIHANCE GADOBENATE DIMEGLUMINE 529 MG/ML IV SOLN

[Series 5: T1 · sagittal · 4.0mm · 0.75mm/px · 2 of 31 slices shown (1 of 3)]
[im 1/31]
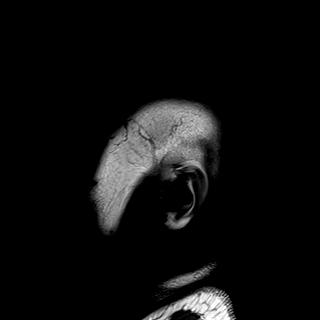
[im 31/31]
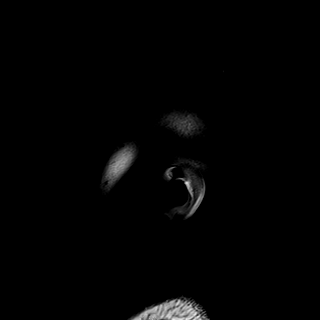

[Series 6: DWI · axial · 3.0mm · 1.56mm/px · z∈[-77,+57]mm · 6 of 86 slices shown (1 of 2)]
[im 1/86]
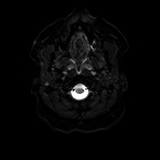
[im 18/86]
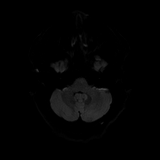
[im 35/86]
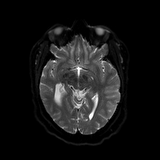
[im 52/86]
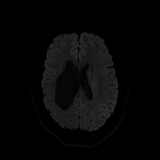
[im 69/86]
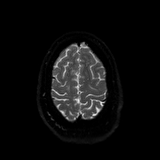
[im 86/86]
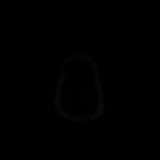

[Series 7: DWI · axial · 3.0mm · 1.56mm/px · z∈[-77,+57]mm · 3 of 43 slices shown (2 of 2)]
[im 1/43]
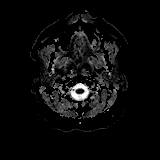
[im 22/43]
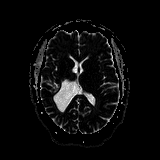
[im 43/43]
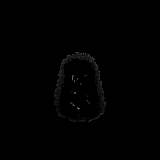

[Series 8: T2 · axial · 4.0mm · 0.36mm/px · z∈[-71,+60]mm · 2 of 27 slices shown (1 of 2)]
[im 1/27]
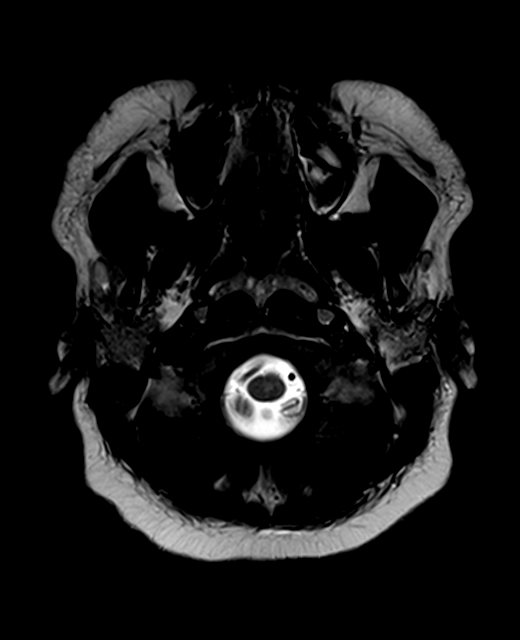
[im 27/27]
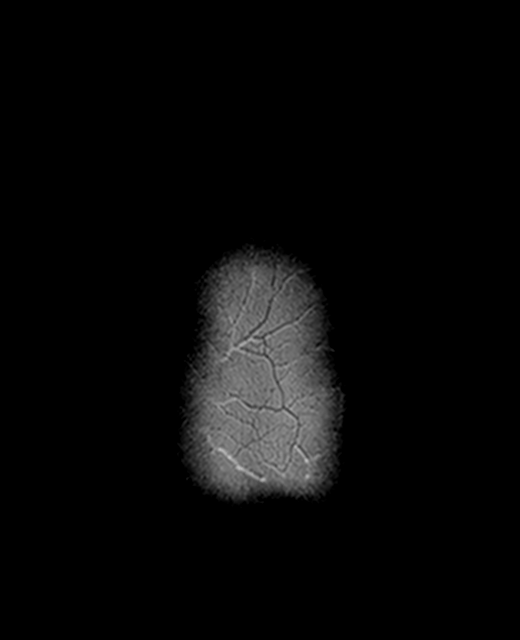

[Series 9: FLAIR · axial · 4.0mm · 0.72mm/px · z∈[-73,+58]mm · 2 of 27 slices shown]
[im 1/27]
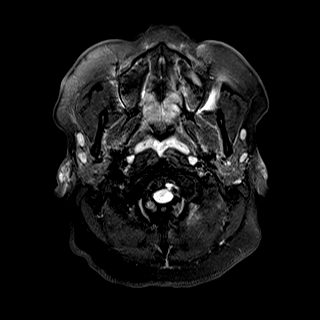
[im 27/27]
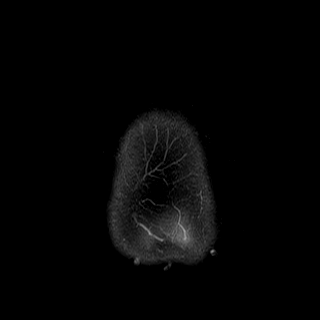

[Series 11: swi_images · axial · 1.5mm · 0.90mm/px · z∈[-75,+64]mm · 7 of 96 slices shown]
[im 1/96]
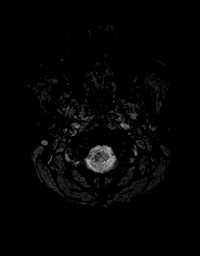
[im 16/96]
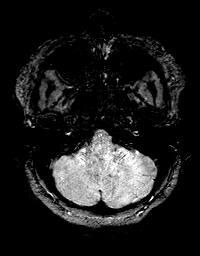
[im 32/96]
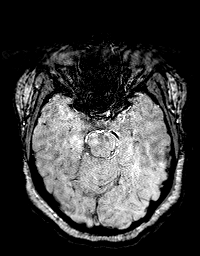
[im 48/96]
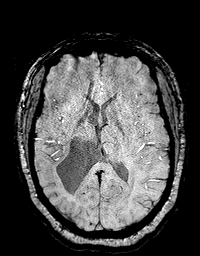
[im 64/96]
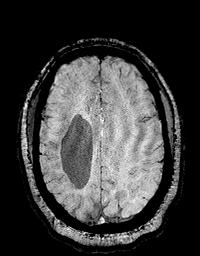
[im 80/96]
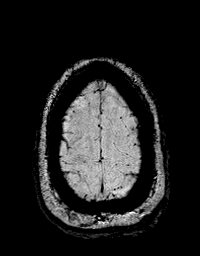
[im 96/96]
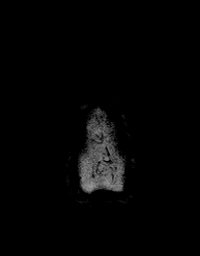

[Series 12: T1 · axial · 1.0mm · 0.90mm/px · z∈[-74,+64]mm · 10 of 144 slices shown (2 of 3)]
[im 1/144]
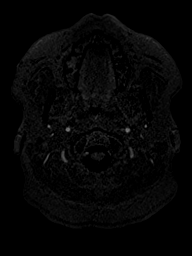
[im 16/144]
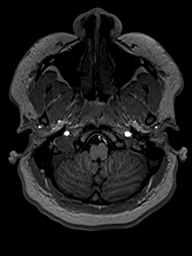
[im 32/144]
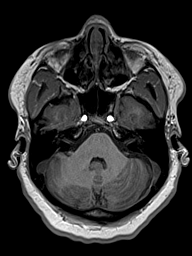
[im 48/144]
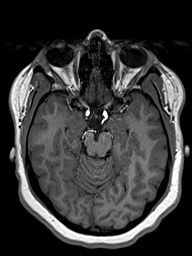
[im 64/144]
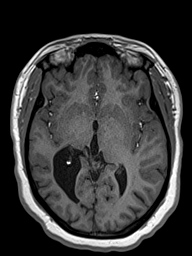
[im 80/144]
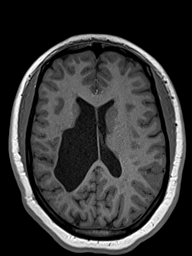
[im 96/144]
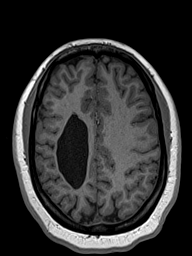
[im 112/144]
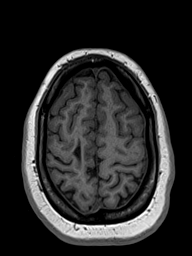
[im 128/144]
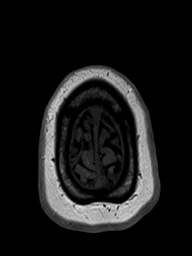
[im 144/144]
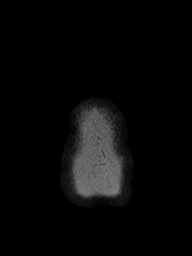

[Series 13: T2 · coronal · 3.0mm · 0.47mm/px · 2 of 25 slices shown (2 of 2)]
[im 1/25]
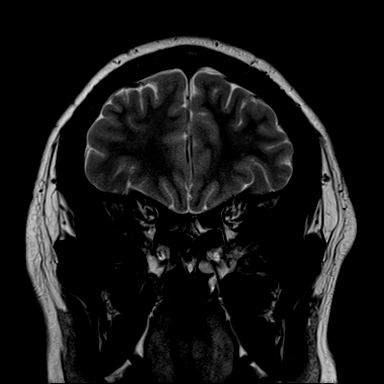
[im 25/25]
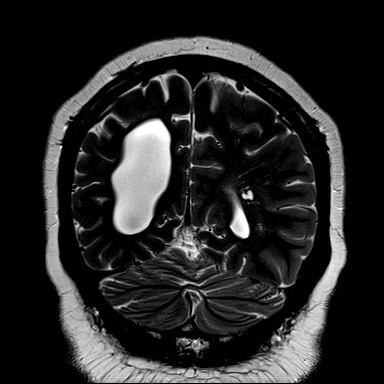

[Series 14: T2 post-contrast · coronal · 4.5mm · 0.36mm/px · 2 of 30 slices shown]
[im 1/30]
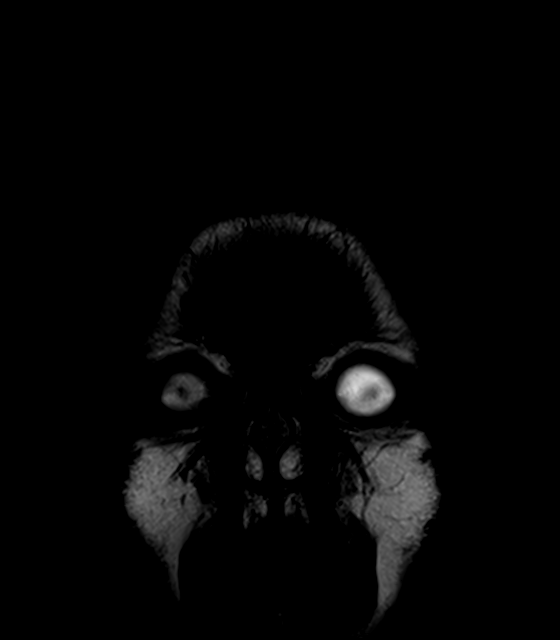
[im 30/30]
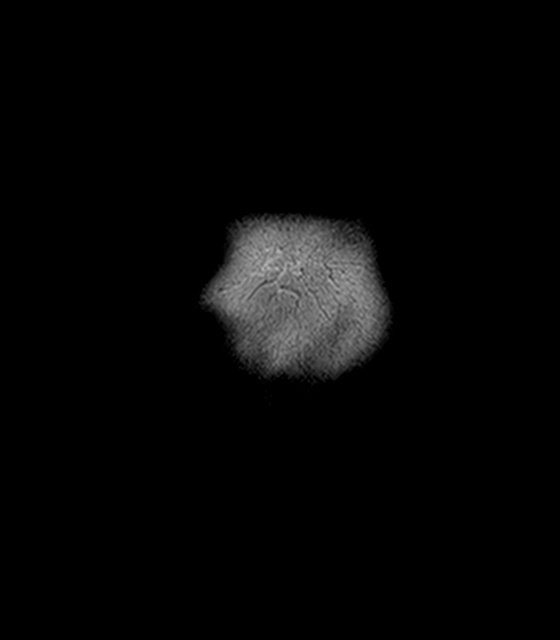

[Series 15: T1 · axial · 1.0mm · 0.90mm/px · z∈[-74,+64]mm · 10 of 144 slices shown (3 of 3)]
[im 1/144]
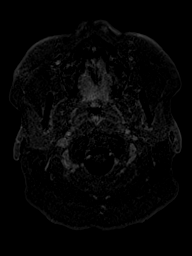
[im 16/144]
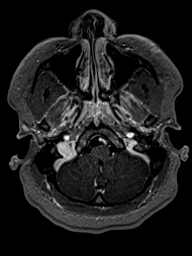
[im 32/144]
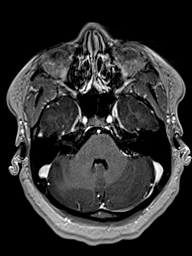
[im 48/144]
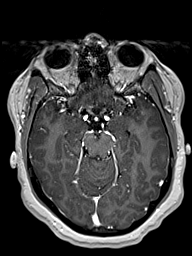
[im 64/144]
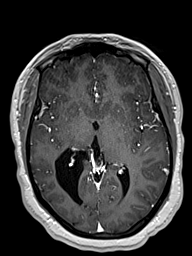
[im 80/144]
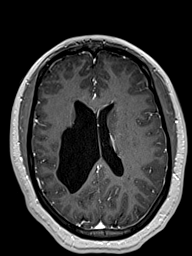
[im 96/144]
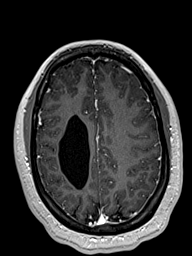
[im 112/144]
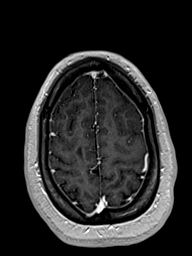
[im 128/144]
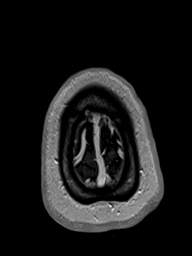
[im 144/144]
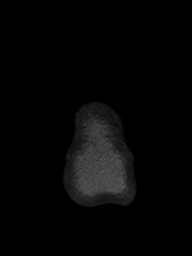

[Series 16: T1 post-contrast · coronal · 4.5mm · 0.72mm/px · 2 of 30 slices shown]
[im 1/30]
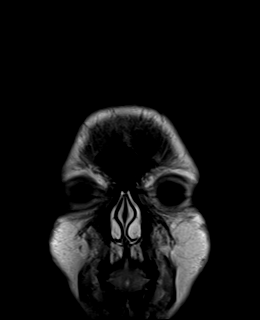
[im 30/30]
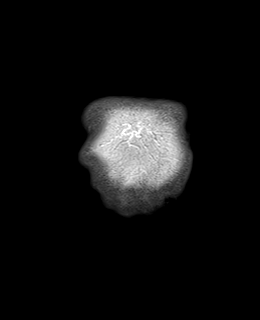

[48 of 48 positions shown; findings below may reference images not displayed]

FINDINGS: No restricted diffusion to suggest acute infarction. No midline
shift, mass effect, evidence of mass lesion, extra-axial collection
or acute intracranial hemorrhage. Cervicomedullary junction and
pituitary are within normal limits. Negative visualized cervical
spine. Major intracranial vascular flow voids are within normal
limits.

Right hemisphere white matter volume loss in the frontal and
parietal lobes with ex vacuo right lateral ventricle involvement
(sparing the temporal and occipital horns). No ventriculomegaly.
Mild associated gliosis tracking into the deep posterior white
matter capsules (series 9, image 13). Associated mild volume loss at
the right midbrain and cerebral peduncle (series 8, image 10). No
chronic cerebral blood products identified. No cortical
encephalomalacia.

Asymmetric T2 hyperintensity of the left hippocampal formation, most
apparent in the tail of the hippocampus (series 13 image 8). The
left dentate gyrus remains visible (image 11). No other signal
abnormality identified. No abnormal enhancement identified.

Visible internal auditory structures appear normal. Mastoids are
clear. Mild paranasal sinus mucosal thickening. Negative orbit and
scalp soft tissues. Normal bone marrow signal.
IMPRESSION: 1. Asymmetric T2 hyperintensity of the left hippocampal formation
suggesting sequelae of recent seizure activity in this setting.
Correlation with EEG may be helpful.
2. Chronic white matter volume loss in the right hemisphere with ex
vacuo lateral ventricle enlargement suggesting sequelae of perinatal
periventricular leukomalacia (PVL).

## 2017-01-01 ENCOUNTER — Ambulatory Visit: Payer: Medicaid Other | Admitting: Neurology

## 2017-01-16 ENCOUNTER — Encounter: Payer: Self-pay | Admitting: Neurology

## 2017-03-11 ENCOUNTER — Ambulatory Visit: Payer: Medicaid Other | Admitting: Neurology

## 2017-09-06 ENCOUNTER — Emergency Department (HOSPITAL_COMMUNITY)
Admission: EM | Admit: 2017-09-06 | Discharge: 2017-09-06 | Disposition: A | Discharge status: Home / Self Care | Payer: Medicaid Other | Attending: Emergency Medicine | Admitting: Emergency Medicine

## 2017-09-06 ENCOUNTER — Emergency Department (HOSPITAL_COMMUNITY): Payer: Medicaid Other

## 2017-09-06 ENCOUNTER — Encounter (HOSPITAL_COMMUNITY): Payer: Self-pay | Admitting: Emergency Medicine

## 2017-09-06 DIAGNOSIS — Z79899 Other long term (current) drug therapy: Secondary | ICD-10-CM | POA: Insufficient documentation

## 2017-09-06 DIAGNOSIS — R1084 Generalized abdominal pain: Secondary | ICD-10-CM | POA: Diagnosis present

## 2017-09-06 LAB — URINALYSIS, ROUTINE W REFLEX MICROSCOPIC
Bilirubin Urine: NEGATIVE
Glucose, UA: NEGATIVE mg/dL
Hgb urine dipstick: NEGATIVE
KETONES UR: 80 mg/dL — AB
Nitrite: NEGATIVE
PH: 6 (ref 5.0–8.0)
PROTEIN: NEGATIVE mg/dL
Specific Gravity, Urine: 1.011 (ref 1.005–1.030)

## 2017-09-06 LAB — CBC
HCT: 37.3 % (ref 36.0–46.0)
Hemoglobin: 12.4 g/dL (ref 12.0–15.0)
MCH: 22.8 pg — AB (ref 26.0–34.0)
MCHC: 33.2 g/dL (ref 30.0–36.0)
MCV: 68.7 fL — ABNORMAL LOW (ref 78.0–100.0)
Platelets: 592 10*3/uL — ABNORMAL HIGH (ref 150–400)
RBC: 5.43 MIL/uL — AB (ref 3.87–5.11)
RDW: 14.6 % (ref 11.5–15.5)
WBC: 10.8 10*3/uL — AB (ref 4.0–10.5)

## 2017-09-06 LAB — I-STAT BETA HCG BLOOD, ED (MC, WL, AP ONLY): I-stat hCG, quantitative: 9.7 m[IU]/mL — ABNORMAL HIGH (ref <5)

## 2017-09-06 LAB — COMPREHENSIVE METABOLIC PANEL
ALK PHOS: 53 U/L (ref 38–126)
ALT: 15 U/L (ref 14–54)
ANION GAP: 10 (ref 5–15)
AST: 19 U/L (ref 15–41)
Albumin: 4.5 g/dL (ref 3.5–5.0)
BUN: 8 mg/dL (ref 6–20)
CALCIUM: 9.4 mg/dL (ref 8.9–10.3)
CO2: 21 mmol/L — AB (ref 22–32)
Chloride: 106 mmol/L (ref 101–111)
Creatinine, Ser: 0.46 mg/dL (ref 0.44–1.00)
Glucose, Bld: 79 mg/dL (ref 65–99)
Potassium: 3.7 mmol/L (ref 3.5–5.1)
SODIUM: 137 mmol/L (ref 135–145)
Total Bilirubin: 0.5 mg/dL (ref 0.3–1.2)
Total Protein: 8.4 g/dL — ABNORMAL HIGH (ref 6.5–8.1)

## 2017-09-06 LAB — LIPASE, BLOOD: LIPASE: 23 U/L (ref 11–51)

## 2017-09-06 MED ORDER — SODIUM CHLORIDE 0.9 % IV BOLUS (SEPSIS)
1000.0000 mL | Freq: Once | INTRAVENOUS | Status: AC
  Administered 2017-09-06: 1000 mL via INTRAVENOUS

## 2017-09-06 MED ORDER — DIPHENHYDRAMINE HCL 25 MG PO TABS
25.0000 mg | ORAL_TABLET | Freq: Four times a day (QID) | ORAL | 0 refills | Status: AC
Start: 2017-09-06 — End: ?

## 2017-09-06 MED ORDER — DICYCLOMINE HCL 10 MG PO CAPS
10.0000 mg | ORAL_CAPSULE | Freq: Once | ORAL | Status: AC
  Administered 2017-09-06: 10 mg via ORAL
  Filled 2017-09-06: qty 1

## 2017-09-06 NOTE — ED Provider Notes (Signed)
[facility]-EMERGENCY DEPT Provider Note   CSN: [id number] Arrival date & time: 09/06/17  1204     History   Chief Complaint Chief Complaint  Patient presents with  . Constipation    HPI Angela Lutz is a 21 y.o. female.  Pt presents to the ED today with abdominal pain.  She has had diarrhea for the past 2-3 days.  She took imodium yesterday and now has constipation.  She also has a lot of abdominal cramping.  She denies any f/c.      Past Medical History:  Diagnosis Date  . Cerebral palsy, hemiplegic (HCC)    Left sided hemiplegia  . Seizures [facility])     Patient Active Problem List   Diagnosis Date Noted  . Chronic daily headache 04/08/2016  . Transient alteration of awareness 02/16/2016  . 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 FLEXOR TENDON REPAIR Left   . REFRACTIVE SURGERY Left     OB History    No data available       Home Medications    Prior to Admission medications   Medication Sig Start Date End Date Taking? Authorizing Provider  aspirin-acetaminophen-caffeine (EXCEDRIN MIGRAINE) [phone] MG tablet Take 2 tablets by mouth every 6 (six) hours as needed for headache.   Yes [provider]  loperamide (IMODIUM A-D) 2 MG capsule Take 4 mg by mouth as needed for diarrhea or loose stools.   Yes [provider]  loratadine (CLARITIN) 10 MG tablet Take 1 tablet by mouth at bedtime. 01/31/16  Yes [provider]  montelukast (SINGULAIR) 10 MG tablet Take 1 tablet by mouth at bedtime. 01/31/16  Yes [provider]  Norethin Ace-Eth Estrad-FE (MIBELAS 24 FE) 1-20 MG-MCG(24) CHEW Chew 1 tablet by mouth daily.   Yes [provider]  diphenhydrAMINE (BENADRYL) 25 MG tablet Take 1 tablet (25 mg total) by mouth every 6 (six) hours. 09/06/17   Jacalyn Lefevre, MD    Family History Family History  Problem Relation Age of Onset  . Hypertension Mother      Social History Social History  Substance Use Topics  . Smoking status: Never Smoker  . Smokeless tobacco: Not on file  . Alcohol use No     Allergies   Patient has no known allergies.   Review of Systems Review of Systems  Gastrointestinal: Positive for abdominal pain and constipation.  All other systems reviewed and are negative.    Physical Exam Updated Vital Signs BP (!) 183/103 (BP Location: Right Arm)   Pulse (!) 112   Temp 98.6 F (37 C) (Oral)   Resp 18   Ht  (1.575 m)   Wt 94.3 kg (208 lb)   LMP 07/26/2017 Comment: hcg = 9.7  SpO2 100%   BMI 38.04 kg/m   Physical Exam  Constitutional: She is oriented to person, place, and time. She appears well-developed and well-nourished.  HENT:  Head: Normocephalic and atraumatic.  Right Ear: External ear normal.  Left Ear: External ear normal.  Nose: Nose normal.  Mouth/Throat: Oropharynx is clear and moist.  Eyes: Pupils are equal, round, and reactive to light. Conjunctivae and EOM are normal.  Neck: Normal range of motion. Neck supple.  Cardiovascular: Normal rate, regular rhythm, normal heart sounds and intact distal pulses.   Pulmonary/Chest: Effort normal and breath sounds normal.  Abdominal: Soft. Bowel sounds are normal.  Musculoskeletal: Normal range of motion.  Neurological: She is  alert and oriented to person, place, and time.  Skin: Skin is warm and dry.  Psychiatric: She has a normal mood and affect. Her behavior is normal. Judgment and thought content normal.  Nursing note and vitals reviewed.    ED Treatments / Results  Labs (all labs ordered are listed, but only abnormal results are displayed) Labs Reviewed  COMPREHENSIVE METABOLIC PANEL - Abnormal; Notable for the following:       Result Value   CO2 21 (*)    Total Protein 8.4 (*)    All other components within normal limits  CBC - Abnormal; Notable for the following:    WBC 10.8 (*)    RBC 5.43 (*)    MCV 68.7 (*)    MCH 22.8  (*)    Platelets 592 (*)    All other components within normal limits  URINALYSIS, ROUTINE W REFLEX MICROSCOPIC - Abnormal; Notable for the following:    APPearance HAZY (*)    Ketones, ur 80 (*)    Leukocytes, UA TRACE (*)    Bacteria, UA RARE (*)    Squamous Epithelial / LPF 6-30 (*)    All other components within normal limits  I-STAT BETA HCG BLOOD, ED ([facility], [facility], [facility] ONLY) - Abnormal; Notable for the following:    I-stat hCG, quantitative 9.7 (*)    All other components within normal limits  LIPASE, BLOOD    EKG  EKG Interpretation None       Radiology Dg Abd Acute W/chest  Result Date: 09/06/2017 CLINICAL DATA:  Diarrhea and abdominal pain for 5 days. EXAM: DG ABDOMEN ACUTE W/ 1V CHEST COMPARISON:  None. FINDINGS: The lungs appear clear.  Cardiac and mediastinal contours normal. No pleural effusion identified. No free intraperitoneal gas beneath the hemidiaphragms. No dilated bowel noted. Most of the small bowel and distal colon is gas free. No significant abnormal calcifications observed. IMPRESSION: 1. Unremarkable bowel gas pattern.  The lungs appear clear. Electronically Signed   By: Gaylyn Rong M.D.   On: 09/06/2017 13:55    Procedures Procedures (including critical care time)  Medications Ordered in ED Medications  dicyclomine (BENTYL) capsule 10 mg (not administered)  sodium chloride 0.9 % bolus 1,000 mL (1,000 mLs Intravenous New Bag/Given 09/06/17 1246)     Initial Impression / Assessment and Plan / ED Course  I have reviewed the triage vital signs and the nursing notes.  Pertinent labs & imaging results that were available during my care of the patient were reviewed by me and considered in my medical decision making (see chart for details).     Pt's quant is slightly more than negative (9.7).  I suspect this is a lab error.  Pt has not been recently sexually active.  I told her that I did not think she was early pregnant, but she does need to repeat a  pregnancy test in 2 weeks to be sure.  The pt will be started on bentyl and instructed to increase fiber in her diet.  She knows to return if worse.  Final Clinical Impressions(s) / ED Diagnoses   Final diagnoses:  Generalized abdominal pain    New Prescriptions New Prescriptions   DIPHENHYDRAMINE (BENADRYL) 25 MG TABLET    Take 1 tablet (25 mg total) by mouth every 6 (six) hours.     Jacalyn Lefevre, MD 09/06/17 1416

## 2017-09-06 NOTE — Discharge Instructions (Signed)
Repeat pregnancy test in 2 weeks

## 2017-09-06 NOTE — ED Triage Notes (Signed)
Patient c/o constant diarrhea onset of Monday. Pt states this has happened before but usually stops after 2-3 days. Pt states she took imodium yesterday around 1500 and is now feeling constipated.
